# Patient Record
Sex: Male | Born: 1995 | Race: White | Hispanic: No | Marital: Single | Smoking: Former smoker
Health system: Southern US, Community
[De-identification: ages and names within clinical notes are randomized; demographics above are authoritative.]

## PROBLEM LIST (undated history)

## (undated) DIAGNOSIS — M1990 Unspecified osteoarthritis, unspecified site: Secondary | ICD-10-CM

## (undated) DIAGNOSIS — R569 Unspecified convulsions: Secondary | ICD-10-CM

---
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
## 2019-12-16 ENCOUNTER — Other Ambulatory Visit: Payer: Self-pay

## 2019-12-16 ENCOUNTER — Emergency Department (INDEPENDENT_AMBULATORY_CARE_PROVIDER_SITE_OTHER): Payer: Self-pay

## 2019-12-16 ENCOUNTER — Emergency Department
Admission: EM | Admit: 2019-12-16 | Discharge: 2019-12-16 | Disposition: A | Discharge status: Home / Self Care | Payer: Self-pay | Source: Home / Self Care

## 2019-12-16 ENCOUNTER — Encounter: Payer: Self-pay | Admitting: Emergency Medicine

## 2019-12-16 DIAGNOSIS — K921 Melena: Secondary | ICD-10-CM

## 2019-12-16 DIAGNOSIS — R1033 Periumbilical pain: Secondary | ICD-10-CM

## 2019-12-16 HISTORY — DX: Unspecified convulsions: R56.9

## 2019-12-16 HISTORY — DX: Unspecified osteoarthritis, unspecified site: M19.90

## 2019-12-16 LAB — POCT CBC W AUTO DIFF (K'VILLE URGENT CARE)

## 2019-12-16 LAB — POC HEMOCCULT BLD/STL (OFFICE/1-CARD/DIAGNOSTIC)
Card #1 Date: NEGATIVE
Fecal Occult Blood, POC: NEGATIVE

## 2019-12-16 NOTE — ED Triage Notes (Signed)
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

## 2019-12-16 NOTE — Discharge Instructions (Signed)
  It is recommended you eat a bland diet the next few days until your pain improves. Try to avoid fried, fatty, spicy, or acidic foods. Also avoid dairy as this can make your digestive tract more irritated.  Be sure to stay well hydrated. Try to avoid food and drink that are red in color the next few days as to not mistake it with blood.  Be cause to call to schedule a follow up appointment with your family doctor and a GI specialist (stomach doctor) for further evaluation and treatment of your symptoms. If you continue to have bleeding, you may need more imaging such as a CT scan or colonoscopy.  Call 911 or go to the hospital if you develop worsening pain, unable to keep down fluids, worsening bleeding, dizziness, passing out, or other new concerning symptoms develop.

## 2019-12-16 NOTE — ED Triage Notes (Signed)
Patient here for reports of seeing blood in stool this morning along with lower abdominal pain. While being triaged he became light headed; able to climb onto exam table; has not had any food since last night so given cola and crackers. No known exposure to covid positive person.

## 2019-12-16 NOTE — ED Provider Notes (Signed)
[facility]    CSN: [id number] Arrival date & time: 12/16/19  1032      History   Chief Complaint Chief Complaint  Patient presents with  . Rectal Bleeding  . Abdominal Pain    HPI Trevor Shaffer is a 24 y.o. male.   HPI  Trevor Shaffer is a 24 y.o. male presenting to UC with c/o noticing blood in his stool this morning with c/o centralized abdominal pain.  He states it looked like there were blood clots in his stool.  Denies straining to have a BM. Denies constipation or diarrhea. No fever, chills, nausea or vomiting. No urinary symptoms.  Pt became lightheaded in triage but symptoms resolved after given crackers and soda.  He has not had anything to eat since last night but pt states its normal for him to skip breakfast.  No recent change to his daily medications, keppra and voltaren, which he takes for seizures and arthritis.  He takes tylenol on occasion but no other NSAIDs recently due to being on voltaren.  Hx of GERD in the past but states it only "acts up" when he eats acidic foods, which he has not eaten recently. No hx of abdominal surgeries. Denies alcohol or drug use.     Past Medical History:  Diagnosis Date  . Arthritis   . Seizures (HCC)     There are no problems to display for this patient.   History reviewed. No pertinent surgical history.     Home Medications    Prior to Admission medications   Medication Sig Start Date End Date Taking? Authorizing Provider  diclofenac (VOLTAREN) 75 MG EC tablet Take 75 mg by mouth 2 (two) times daily.   Yes [provider]  levETIRAcetam (KEPPRA) 500 MG tablet Take 500 mg by mouth 2 (two) times daily.   Yes [provider]    Family History No family history on file.  Social History Social History   Tobacco Use  . Smoking status: Former Games developer  . Smokeless tobacco: Never Used  Substance Use Topics  . Alcohol use: Not on file  . Drug use: Not on file     Allergies   Patient  has no known allergies.   Review of Systems Review of Systems  Constitutional: Negative for appetite change, chills, diaphoresis, fatigue, fever and unexpected weight change.  HENT: Negative for congestion and sore throat.   Respiratory: Negative for cough and chest tightness.   Gastrointestinal: Positive for abdominal pain and blood in stool. Negative for abdominal distention, anal bleeding, constipation, diarrhea, nausea, rectal pain and vomiting.  Genitourinary: Negative for dysuria, flank pain, frequency and hematuria.  Neurological: Positive for light-headedness. Negative for dizziness and headaches.     Physical Exam Triage Vital Signs ED Triage Vitals  Enc Vitals Group     BP 12/16/19 1111 107/67     Pulse Rate 12/16/19 1111 (!) 55     Resp 12/16/19 1111 16     Temp 12/16/19 1111 98.2 F (36.8 C)     Temp Source 12/16/19 1111 Oral     SpO2 12/16/19 1111 98 %     Weight 12/16/19 1113 175 lb (79.4 kg)     Height 12/16/19 1113 5\' 8"  (1.727 m)     Head Circumference --      Peak Flow --      Pain Score 12/16/19 1112 4     Pain Loc --      Pain Edu? --  Excl. in GC? --    No data found.  Updated Vital Signs BP 107/67 (BP Location: Left Arm)   Pulse (!) 55   Temp 98.2 F (36.8 C) (Oral)   Resp 16   Ht 5\' 8"  (1.727 m)   Wt 175 lb (79.4 kg)   SpO2 98%   BMI 26.61 kg/m   Visual Acuity Right Eye Distance:   Left Eye Distance:   Bilateral Distance:    Right Eye Near:   Left Eye Near:    Bilateral Near:     Physical Exam Vitals and nursing note reviewed. Exam conducted with a chaperone present.  Constitutional:      General: He is not in acute distress.    Appearance: He is well-developed. He is not ill-appearing, toxic-appearing or diaphoretic.  HENT:     Head: Normocephalic and atraumatic.  Cardiovascular:     Rate and Rhythm: Regular rhythm. Bradycardia present.  Pulmonary:     Effort: Pulmonary effort is normal. No respiratory distress.      Breath sounds: Normal breath sounds.  Abdominal:     General: There is no distension.     Palpations: Abdomen is soft.     Tenderness: There is abdominal tenderness in the periumbilical area. There is no right CVA tenderness, left CVA tenderness, guarding or rebound. Negative signs include Murphy's sign, Rovsing's sign, McBurney's sign, psoas sign and obturator sign.  Genitourinary:    Rectum: Normal. Guaiac result negative. No mass, tenderness, anal fissure, external hemorrhoid or internal hemorrhoid. Normal anal tone.  Musculoskeletal:        General: Normal range of motion.     Cervical back: Normal range of motion.  Skin:    General: Skin is warm and dry.  Neurological:     Mental Status: He is alert and oriented to person, place, and time.  Psychiatric:        Behavior: Behavior normal.      UC Treatments / Results  Labs (all labs ordered are listed, but only abnormal results are displayed) Labs Reviewed  COMPLETE METABOLIC PANEL WITH GFR  LIPASE  POCT CBC W AUTO DIFF ([facility])  POC HEMOCCULT BLD/STL (OFFICE/1-CARD/DIAGNOSTIC)    EKG   Radiology DG Abdomen 1 View  Result Date: 12/16/2019 CLINICAL DATA:  Periumbilical abdominal pain, hematochezia. EXAM: ABDOMEN - 1 VIEW COMPARISON:  None. FINDINGS: Bowel gas pattern is nonobstructive. No evidence of soft tissue mass or abnormal fluid collection. No evidence of renal or ureteral calculi. Visualized osseous structures are unremarkable. IMPRESSION: No acute findings.  Nonobstructive bowel gas pattern. Electronically Signed   By: 12/18/2019 M.D.   On: 12/16/2019 12:42    Procedures Procedures (including critical care time)  Medications Ordered in UC Medications - No data to display  Initial Impression / Assessment and Plan / UC Course  I have reviewed the triage vital signs and the nursing notes.  Pertinent labs & imaging results that were available during my care of the patient were reviewed by me and  considered in my medical decision making (see chart for details).     Centralized abdominal tenderness, otherwise normal exam. CBC: WNL Hemoccult: negative CMP and Lipase: pending Doubt appendicitis at this time or other emergent process, however, discussed signs/symptoms that warrant further evaluation and treatment in emergency department.  Encouraged f/u with PCP and GI this week if not improving AVS provided  Final Clinical Impressions(s) / UC Diagnoses   Final diagnoses:  Periumbilical abdominal pain  Hematochezia  Discharge Instructions      It is recommended you eat a bland diet the next few days until your pain improves. Try to avoid fried, fatty, spicy, or acidic foods. Also avoid dairy as this can make your digestive tract more irritated.  Be sure to stay well hydrated. Try to avoid food and drink that are red in color the next few days as to not mistake it with blood.  Be cause to call to schedule a follow up appointment with your family doctor and a GI specialist (stomach doctor) for further evaluation and treatment of your symptoms. If you continue to have bleeding, you may need more imaging such as a CT scan or colonoscopy.  Call 911 or go to the hospital if you develop worsening pain, unable to keep down fluids, worsening bleeding, dizziness, passing out, or other new concerning symptoms develop.      ED Prescriptions    None     PDMP not reviewed this encounter.   Noe Gens, [place] 12/16/19 1358

## 2019-12-17 LAB — COMPLETE METABOLIC PANEL WITH GFR
AG Ratio: 2.2 (calc) (ref 1.0–2.5)
ALT: 41 U/L (ref 9–46)
AST: 19 U/L (ref 10–40)
Albumin: 4.7 g/dL (ref 3.6–5.1)
Alkaline phosphatase (APISO): 62 U/L (ref 36–130)
BUN: 13 mg/dL (ref 7–25)
CO2: 28 mmol/L (ref 20–32)
Calcium: 9.6 mg/dL (ref 8.6–10.3)
Chloride: 104 mmol/L (ref 98–110)
Creat: 0.89 mg/dL (ref 0.60–1.35)
GFR, Est African American: 141 mL/min/{1.73_m2} (ref >=60)
GFR, Est Non African American: 121 mL/min/{1.73_m2} (ref >=60)
Globulin: 2.1 g/dL (calc) (ref 1.9–3.7)
Glucose, Bld: 58 mg/dL — ABNORMAL LOW (ref 65–99)
Potassium: 4.5 mmol/L (ref 3.5–5.3)
Sodium: 138 mmol/L (ref 135–146)
Total Bilirubin: 0.4 mg/dL (ref 0.2–1.2)
Total Protein: 6.8 g/dL (ref 6.1–8.1)

## 2019-12-17 LAB — LIPASE: Lipase: 8 U/L (ref 7–60)

## 2021-07-17 IMAGING — DX DG ABDOMEN 1V
2 series · 2 of 2 positions shown · non-contrast
Comparison: None.

CLINICAL DATA: Periumbilical abdominal pain, hematochezia.

EXAM:
ABDOMEN - 1 VIEW

[abdomen kub (1 of 2)]
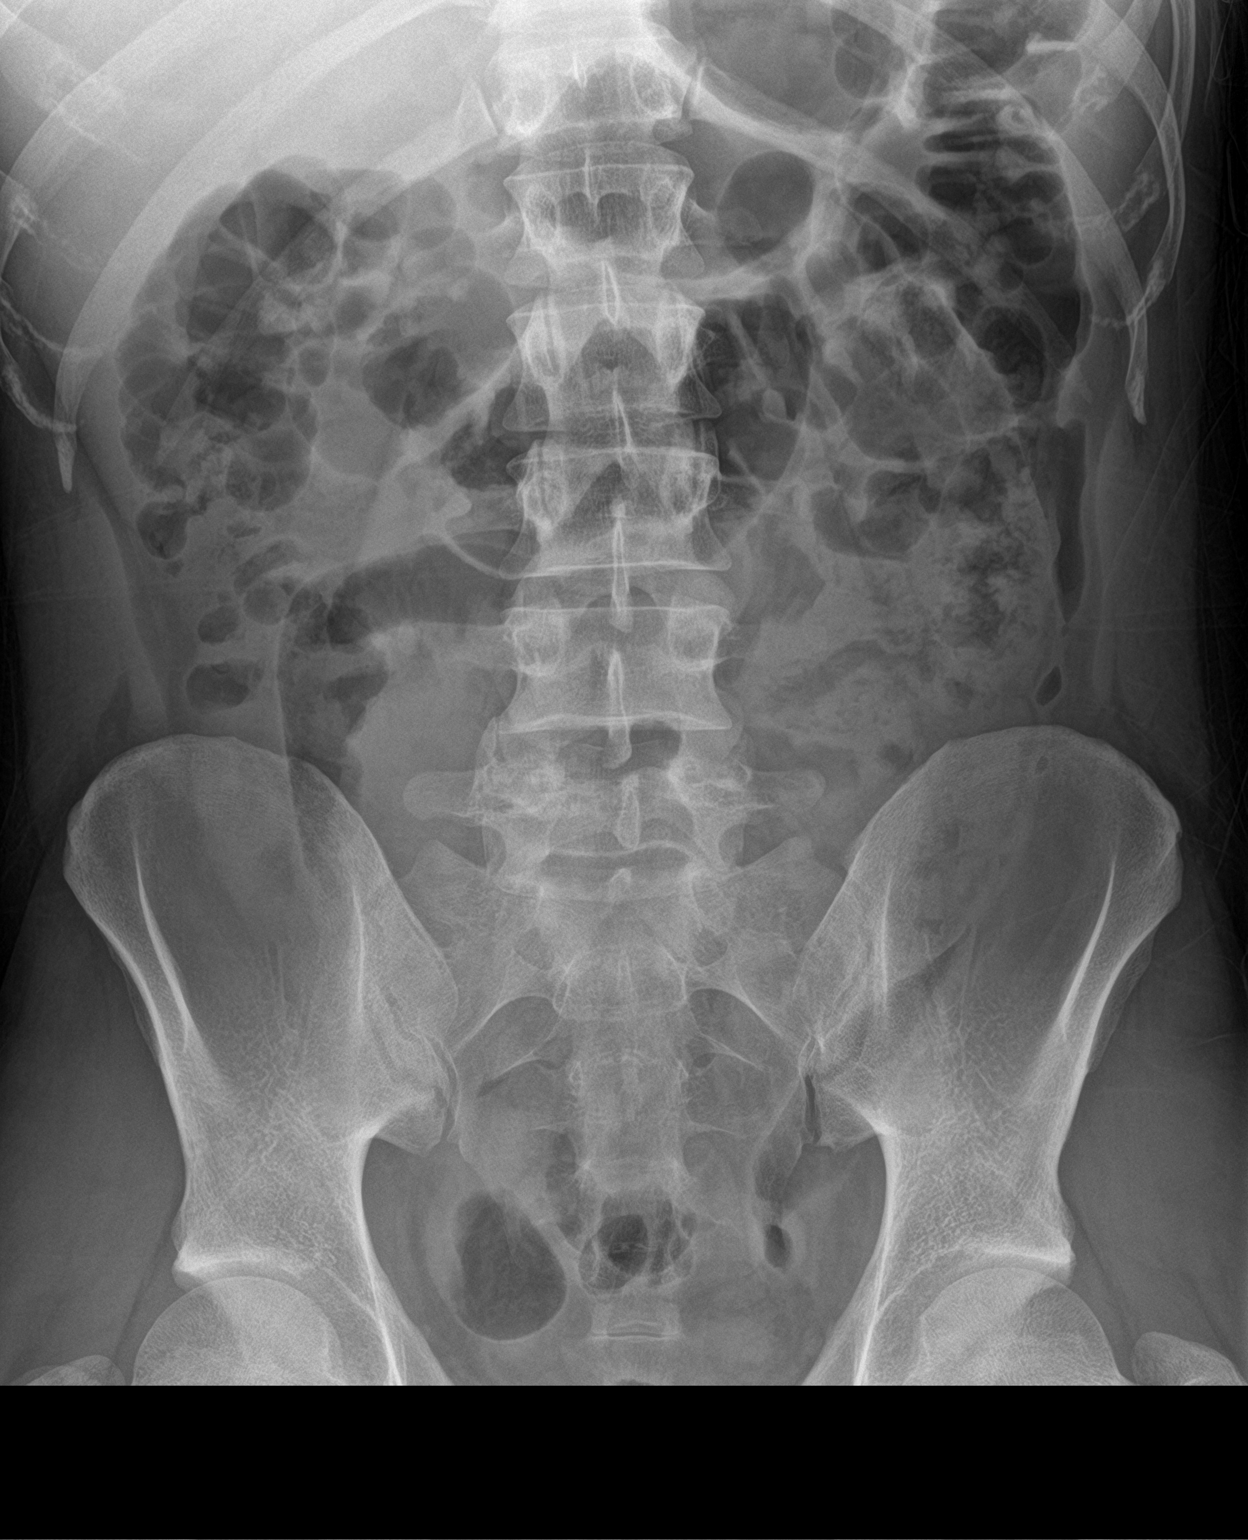

[abdomen kub (2 of 2)]
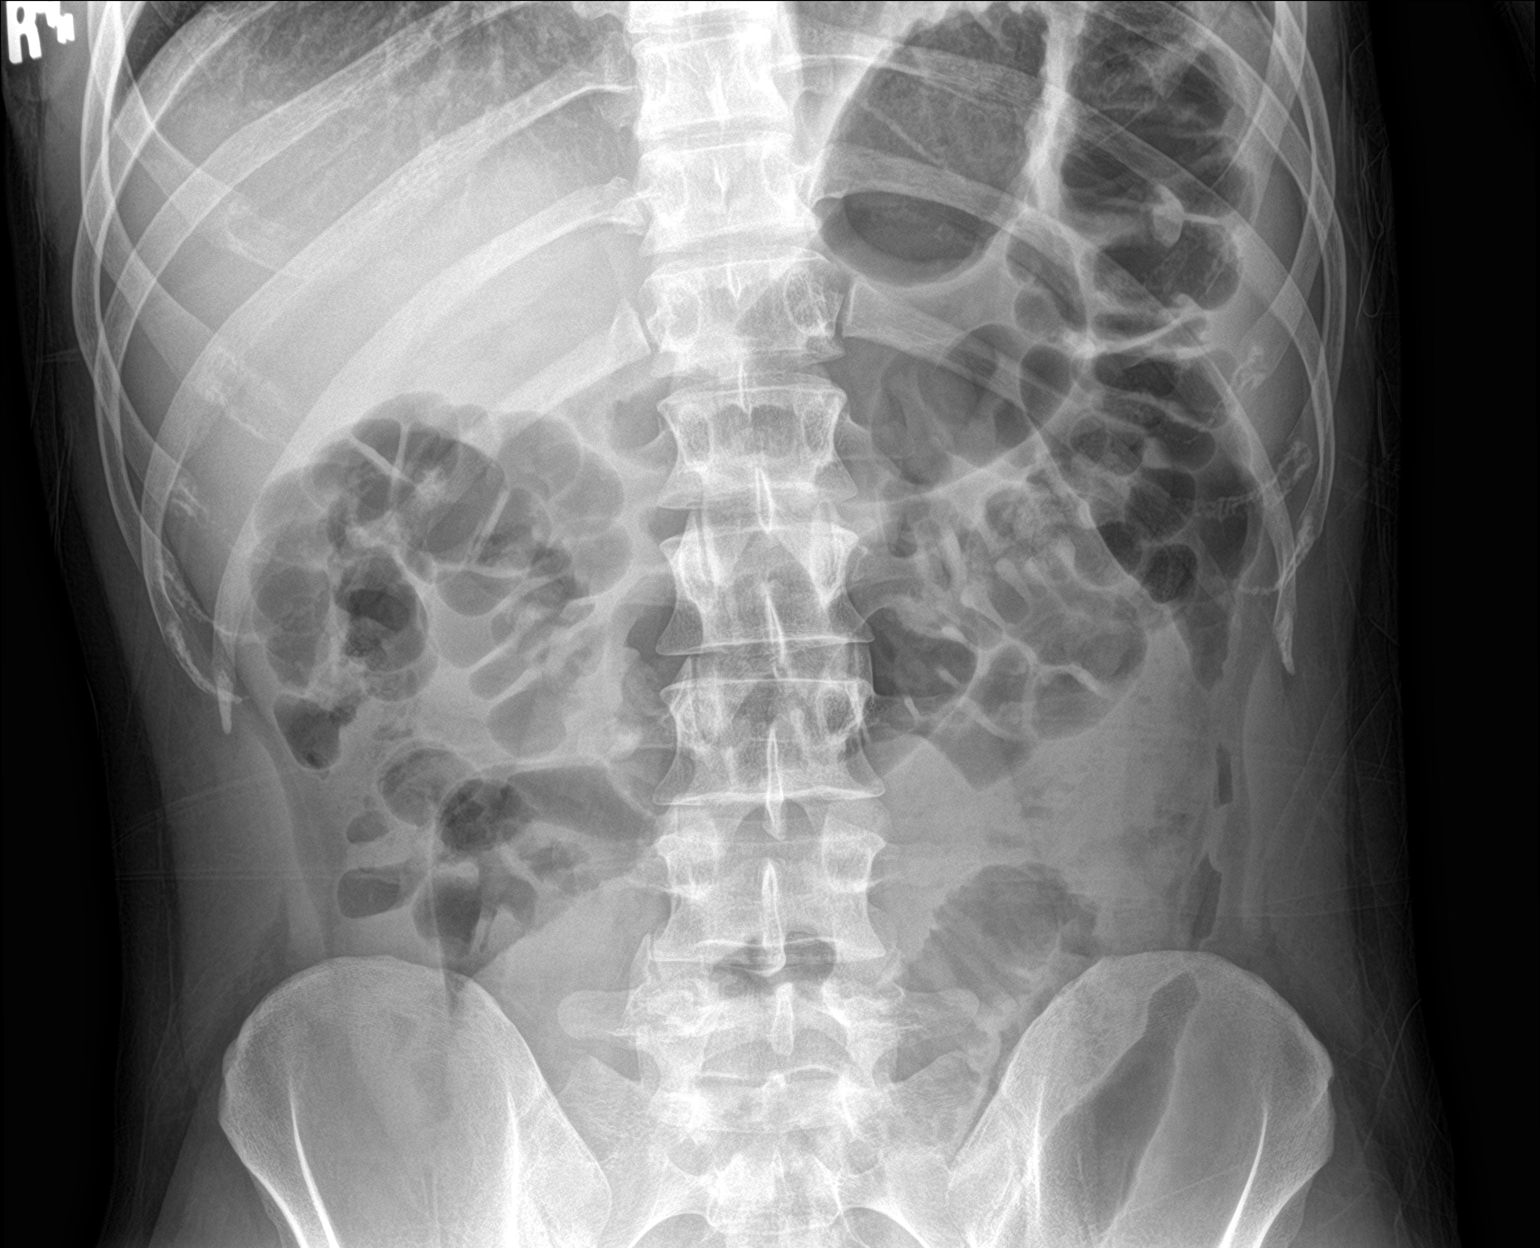

[2 of 2 positions shown; findings below may reference images not displayed]

FINDINGS: Bowel gas pattern is nonobstructive. No evidence of soft tissue mass
or abnormal fluid collection. No evidence of renal or ureteral
calculi. Visualized osseous structures are unremarkable.
IMPRESSION: No acute findings.  Nonobstructive bowel gas pattern.
